# Patient Record
Sex: Female | Born: 1980
Health system: Southern US, Community
[De-identification: ages and names within clinical notes are randomized; demographics above are authoritative.]

## PROBLEM LIST (undated history)

## (undated) HISTORY — PX: BREAST EXCISIONAL BIOPSY: SUR124

---

## 2019-05-27 ENCOUNTER — Encounter (HOSPITAL_COMMUNITY): Payer: Self-pay | Admitting: Emergency Medicine

## 2019-05-27 ENCOUNTER — Other Ambulatory Visit: Payer: Self-pay

## 2019-05-27 ENCOUNTER — Emergency Department (HOSPITAL_COMMUNITY)
Admission: EM | Admit: 2019-05-27 | Discharge: 2019-05-28 | Disposition: A | Discharge status: Home / Self Care | Payer: 59 | Attending: Emergency Medicine | Admitting: Emergency Medicine

## 2019-05-27 DIAGNOSIS — Z5321 Procedure and treatment not carried out due to patient leaving prior to being seen by health care provider: Secondary | ICD-10-CM | POA: Insufficient documentation

## 2019-05-27 DIAGNOSIS — H5712 Ocular pain, left eye: Secondary | ICD-10-CM | POA: Insufficient documentation

## 2019-05-27 NOTE — ED Triage Notes (Signed)
Pt c/o pain on her left eye, states something got inside her eye this evening.

## 2019-12-16 ENCOUNTER — Other Ambulatory Visit: Payer: Self-pay | Admitting: Family Medicine

## 2019-12-16 DIAGNOSIS — H539 Unspecified visual disturbance: Secondary | ICD-10-CM

## 2020-01-04 ENCOUNTER — Ambulatory Visit
Admission: RE | Admit: 2020-01-04 | Discharge: 2020-01-04 | Disposition: A | Discharge status: Home / Self Care | Payer: 59 | Source: Ambulatory Visit | Attending: Family Medicine | Admitting: Family Medicine

## 2020-01-04 DIAGNOSIS — H539 Unspecified visual disturbance: Secondary | ICD-10-CM

## 2020-01-28 ENCOUNTER — Ambulatory Visit
Admission: RE | Admit: 2020-01-28 | Discharge: 2020-01-28 | Disposition: A | Discharge status: Home / Self Care | Payer: 59 | Source: Ambulatory Visit | Attending: Family Medicine | Admitting: Family Medicine

## 2020-01-28 ENCOUNTER — Other Ambulatory Visit: Payer: Self-pay

## 2020-01-28 MED ORDER — GADOBENATE DIMEGLUMINE 529 MG/ML IV SOLN
20.0000 mL | Freq: Once | INTRAVENOUS | Status: AC | PRN
  Administered 2020-01-28: 20 mL via INTRAVENOUS

## 2020-02-19 ENCOUNTER — Ambulatory Visit
Admission: RE | Admit: 2020-02-19 | Discharge: 2020-02-19 | Disposition: A | Discharge status: Home / Self Care | Payer: 59 | Source: Ambulatory Visit | Attending: Family Medicine | Admitting: Family Medicine

## 2020-02-19 ENCOUNTER — Ambulatory Visit: Payer: 59 | Admitting: Allergy

## 2020-02-19 ENCOUNTER — Other Ambulatory Visit: Payer: Self-pay | Admitting: Family Medicine

## 2020-02-19 ENCOUNTER — Other Ambulatory Visit: Payer: Self-pay

## 2020-02-19 DIAGNOSIS — R071 Chest pain on breathing: Secondary | ICD-10-CM

## 2020-02-19 MED ORDER — IOPAMIDOL (ISOVUE-370) INJECTION 76%
75.0000 mL | Freq: Once | INTRAVENOUS | Status: AC | PRN
  Administered 2020-02-19: 75 mL via INTRAVENOUS

## 2020-02-27 ENCOUNTER — Ambulatory Visit: Payer: 59 | Attending: Internal Medicine

## 2020-02-27 DIAGNOSIS — Z23 Encounter for immunization: Secondary | ICD-10-CM

## 2020-02-27 NOTE — Progress Notes (Signed)
   Covid-19 Vaccination Clinic  Name:  Kim Olson    MRN: [id number] DOB: 1980-05-14  02/27/2020  Kim Olson was observed post Covid-19 immunization for 15 minutes without incident. She was provided with Vaccine Information Sheet and instruction to access the V-Safe system.   Kim Olson was instructed to call 911 with any severe reactions post vaccine: Marland Kitchen Difficulty breathing  . Swelling of face and throat  . A fast heartbeat  . A bad rash all over body  . Dizziness and weakness   Immunizations Administered    Name Date Dose VIS Date Route   Pfizer COVID-19 Vaccine 02/27/2020  3:16 PM 0.3 mL 12/10/2019 Intramuscular   Manufacturer: [employer], [employer]   Lot: [id number]   NDC: [address]-8

## 2020-08-31 ENCOUNTER — Other Ambulatory Visit: Payer: Self-pay | Admitting: Family Medicine

## 2020-08-31 DIAGNOSIS — R1010 Upper abdominal pain, unspecified: Secondary | ICD-10-CM

## 2020-09-17 ENCOUNTER — Ambulatory Visit
Admission: RE | Admit: 2020-09-17 | Discharge: 2020-09-17 | Disposition: A | Discharge status: Home / Self Care | Payer: 59 | Source: Ambulatory Visit | Attending: Family Medicine | Admitting: Family Medicine

## 2020-09-17 ENCOUNTER — Other Ambulatory Visit: Payer: Self-pay

## 2020-09-17 DIAGNOSIS — R1010 Upper abdominal pain, unspecified: Secondary | ICD-10-CM

## 2021-06-29 ENCOUNTER — Other Ambulatory Visit: Payer: Self-pay | Admitting: Sports Medicine

## 2021-06-29 DIAGNOSIS — M25561 Pain in right knee: Secondary | ICD-10-CM

## 2021-07-04 ENCOUNTER — Other Ambulatory Visit: Payer: 59

## 2021-07-04 ENCOUNTER — Ambulatory Visit
Admission: RE | Admit: 2021-07-04 | Discharge: 2021-07-04 | Disposition: A | Discharge status: Home / Self Care | Payer: 59 | Source: Ambulatory Visit | Attending: Sports Medicine | Admitting: Sports Medicine

## 2021-07-04 DIAGNOSIS — M25561 Pain in right knee: Secondary | ICD-10-CM

## 2022-03-28 ENCOUNTER — Encounter: Payer: 59 | Admitting: Genetic Counselor

## 2022-03-28 ENCOUNTER — Other Ambulatory Visit: Payer: 59

## 2022-05-09 ENCOUNTER — Inpatient Hospital Stay: Payer: 59 | Attending: Genetic Counselor | Admitting: Genetic Counselor

## 2022-05-09 ENCOUNTER — Other Ambulatory Visit: Payer: Self-pay

## 2022-05-09 ENCOUNTER — Encounter: Payer: Self-pay | Admitting: Genetic Counselor

## 2022-05-09 ENCOUNTER — Inpatient Hospital Stay: Payer: 59

## 2022-05-09 DIAGNOSIS — Z8052 Family history of malignant neoplasm of bladder: Secondary | ICD-10-CM

## 2022-05-09 DIAGNOSIS — Z8041 Family history of malignant neoplasm of ovary: Secondary | ICD-10-CM | POA: Diagnosis not present

## 2022-05-09 DIAGNOSIS — Z801 Family history of malignant neoplasm of trachea, bronchus and lung: Secondary | ICD-10-CM | POA: Diagnosis not present

## 2022-05-09 DIAGNOSIS — Z803 Family history of malignant neoplasm of breast: Secondary | ICD-10-CM | POA: Diagnosis not present

## 2022-05-09 DIAGNOSIS — Z807 Family history of other malignant neoplasms of lymphoid, hematopoietic and related tissues: Secondary | ICD-10-CM

## 2022-05-09 LAB — MISCELLANEOUS TEST

## 2022-05-09 NOTE — Progress Notes (Unsigned)
REFERRING PROVIDER: [facility] @ [place],  [place] [address]  PRIMARY PROVIDER:  [place] [place] [facility] [place]  PRIMARY REASON FOR VISIT:  No diagnosis found.   HISTORY OF PRESENT ILLNESS:   Kim Olson, a 42 y.o. female, was seen for a [facility] cancer genetics consultation at the request of Dr. Sheron Nightingale due to a family history of cancer.  Kim Olson presents to clinic today to discuss the possibility of a hereditary predisposition to cancer, to discuss genetic testing, and to further clarify her future cancer risks, as well as potential cancer risks for family members.   Kim Olson is a 42 y.o. female with no personal history of cancer.    CANCER HISTORY:  Oncology History   No history exists.    RISK FACTORS:  Menarche was at age 54.  Nulliparous.  HRT use: 0 years. Mammogram within the last year: no. Number of breast biopsies: 1.  No past medical history on file.  No past surgical history on file.  Social History   Socioeconomic History   Marital status: Married    Spouse name: Not on file   Number of children: Not on file   Years of education: Not on file   Highest education level: Not on file  Occupational History   Not on file  Tobacco Use   Smoking status: Never   Smokeless tobacco: Never  Substance and Sexual Activity   Alcohol use: Never   Drug use: Never   Sexual activity: Not on file  Other Topics Concern   Not on file  Social History Narrative   Not on file   Social Determinants of Health   Financial Resource Strain: Not on file  Food Insecurity: Not on file  Transportation Needs: Not on file  Physical Activity: Not on file  Stress: Not on file  Social Connections: Not on file     FAMILY HISTORY:  We obtained a detailed, 4-generation family history.  Significant diagnoses are listed below: Family History  Problem Relation Age of Onset   Cancer - Other Mother 61       Ureter cancer    Bladder Cancer Mother    Cancer - Lung Mother    Cancer - Ovarian Maternal Aunt 46   Lymphoma Maternal Aunt    Bladder Cancer Maternal Uncle 11   Breast cancer Maternal Grandmother 63   Cancer - Lung Maternal Grandmother 3   Breast cancer Maternal Great-grandmother        Kim Olson mother was diagnosed with ureter, bladder, and lung cancer and passed away at 49. Her maternal aunt had ovarian cancer at 67. Another maternal aunt had lymphoma at 60, and her maternal uncle had bladder cancer at 69 and passed away within the year. Kim Olson also has a family history of breast cancer in her maternal grandmother and great-grandmother.  Kim Olson is unaware of previous family history of genetic testing for hereditary cancer risks. There is no reported Ashkenazi Jewish ancestry, however patient's maternal and paternal ancestors are of unknown descent. There is no known consanguinity.  GENETIC COUNSELING ASSESSMENT: Kim Olson is a 42 y.o. female with a family history of cancer which is somewhat suggestive of a hereditary predisposition to cancer. We, therefore, discussed and recommended the following at today's visit.   DISCUSSION: We discussed that 5 - 10% of cancer is hereditary, with most cases of hereditary ureter, bladder, and ovarian cancer associated with Lynch syndrome. We discussed  that testing is beneficial for several reasons, including knowing about other cancer risks, identifying potential screening and risk-reduction options that may be appropriate, and to understanding if other family members could be at risk for cancer and allowing them to undergo genetic testing.  We reviewed the characteristics, features and inheritance patterns of hereditary cancer syndromes. We also discussed genetic testing, including the appropriate family members to test, the process of testing, insurance coverage and turn-around-time for results. We discussed the implications of a negative, positive,  carrier and/or variant of uncertain significant result. We discussed that negative results would be uninformative given that Kim Olson does not have a personal history of cancer. We recommended Kim Olson pursue genetic testing for a panel that contains genes associated with Lynch syndrome as well as other common hereditary cancers such as breast cancer.  Kim Olson was offered a common hereditary cancer panel (47 genes) and an expanded pan-cancer panel (77 genes). Kim Olson was informed of the benefits and limitations of each panel, including that expanded pan-cancer panels contain several genes that do not have clear management guidelines at this point in time.  We also discussed that as the number of genes included on a panel increases, the chances of variants of uncertain significance increases.  After considering the benefits and limitations of each gene panel, Kim Olson elected to have the common hereditary cancer panel (47 genes).   Based on Kim Olson family history of cancer, she meets medical criteria for genetic testing. Despite that she meets criteria, she may still have an out of pocket cost. We discussed that if her out of pocket cost for testing is over $100, the laboratory will call and confirm whether she wants to proceed with testing.  If the out of pocket cost of testing is less than $100 she will be billed by the genetic testing laboratory.   We discussed that some people do not want to undergo genetic testing due to fear of genetic discrimination.  A federal law called the Genetic Information Non-Discrimination Act (GINA) of 2008 helps protect individuals against genetic discrimination based on their genetic test results.  It impacts both health insurance and employment.  With health insurance, it protects against increased premiums, being kicked off insurance or being forced to take a test in order to be insured.  For employment it protects against hiring, firing and promoting  decisions based on genetic test results.  GINA does not apply to those in the [employer], those who work for companies with less than 15 employees, and new life insurance or long-term disability insurance policies.  Health status due to a cancer diagnosis is not protected under GINA.  PLAN: After considering the risks, benefits, and limitations, Kim Olson provided informed consent to pursue genetic testing and the blood sample was [facility] to [facility] for analysis of the common hereditary cancer panel (47 genes). Results should be available within approximately 2-3 weeks' time, at which point they will be disclosed by telephone to Kim Olson, as will any additional recommendations warranted by these results. Kim Olson will receive a summary of her genetic counseling visit and a copy of her results once available. This information will also be available in Epic.   Lastly, we encouraged Kim Olson to remain in contact with cancer genetics annually so that we can continuously update the family history and inform her of any changes in cancer genetics and testing that may be of benefit for this family.   Kim Olson questions were answered  to her satisfaction today. Our contact information was provided should additional questions or concerns arise. Thank you for the referral and allowing [place] to share in the care of your patient.   Lucille Passy, MS, [facility] Genetic Counselor [email or web address] (P) [phone]  The patient was seen for a total of 45 minutes in face-to-face genetic counseling.  ***The patient brought ***.  ***The patient was seen alone.  Drs. Lindi Adie and/or Burr Medico were available to discuss this case as needed.  _______________________________________________________________________ For Office Staff:  Number of people involved in session: *** Was an Intern/ student involved with case: {YES/NO:63}

## 2022-05-10 ENCOUNTER — Other Ambulatory Visit (HOSPITAL_COMMUNITY): Payer: Self-pay | Admitting: Pain Medicine

## 2022-05-10 ENCOUNTER — Ambulatory Visit (HOSPITAL_COMMUNITY)
Admission: RE | Admit: 2022-05-10 | Discharge: 2022-05-10 | Disposition: A | Discharge status: Home / Self Care | Payer: 59 | Source: Ambulatory Visit | Attending: Pain Medicine | Admitting: Pain Medicine

## 2022-05-10 ENCOUNTER — Encounter: Payer: Self-pay | Admitting: Genetic Counselor

## 2022-05-10 DIAGNOSIS — R109 Unspecified abdominal pain: Secondary | ICD-10-CM | POA: Insufficient documentation

## 2022-05-10 DIAGNOSIS — Z8052 Family history of malignant neoplasm of bladder: Secondary | ICD-10-CM | POA: Insufficient documentation

## 2022-05-12 ENCOUNTER — Other Ambulatory Visit: Payer: Self-pay | Admitting: Nurse Practitioner

## 2022-05-12 DIAGNOSIS — Z1231 Encounter for screening mammogram for malignant neoplasm of breast: Secondary | ICD-10-CM

## 2022-05-17 ENCOUNTER — Other Ambulatory Visit: Payer: Self-pay | Admitting: Family Medicine

## 2022-05-17 DIAGNOSIS — N200 Calculus of kidney: Secondary | ICD-10-CM

## 2022-05-18 ENCOUNTER — Ambulatory Visit
Admission: RE | Admit: 2022-05-18 | Discharge: 2022-05-18 | Disposition: A | Discharge status: Home / Self Care | Payer: 59 | Source: Ambulatory Visit | Attending: Family Medicine | Admitting: Family Medicine

## 2022-05-18 DIAGNOSIS — N200 Calculus of kidney: Secondary | ICD-10-CM

## 2022-05-22 ENCOUNTER — Encounter: Payer: Self-pay | Admitting: Genetic Counselor

## 2022-05-22 ENCOUNTER — Telehealth: Payer: Self-pay | Admitting: Genetic Counselor

## 2022-05-22 DIAGNOSIS — Z1379 Encounter for other screening for genetic and chromosomal anomalies: Secondary | ICD-10-CM | POA: Insufficient documentation

## 2022-05-22 DIAGNOSIS — Z9189 Other specified personal risk factors, not elsewhere classified: Secondary | ICD-10-CM | POA: Insufficient documentation

## 2022-05-22 NOTE — Telephone Encounter (Signed)
I contacted [name] to discuss her genetic testing results. No pathogenic variants were identified in the 34 genes analyzed. Detailed clinic note to follow.  The test report has been scanned into EPIC and is located under the Molecular Pathology section of the Results Review tab.  A portion of the result report is included below for reference.   Kim Passy, MS, [facility] Genetic Counselor [email or web address] (P) [phone]

## 2022-06-02 ENCOUNTER — Ambulatory Visit: Payer: Self-pay | Admitting: Genetic Counselor

## 2022-06-02 DIAGNOSIS — Z1379 Encounter for other screening for genetic and chromosomal anomalies: Secondary | ICD-10-CM

## 2022-06-02 NOTE — Progress Notes (Signed)
HPI:   Ms. Kim Olson was previously seen in the [facility] due to a family history of cancer and concerns regarding a hereditary predisposition to cancer. Please refer to our prior cancer genetics clinic note for more information regarding our discussion, assessment and recommendations, at the time. Kim Olson recent genetic test results were disclosed to her, as were recommendations warranted by these results. These results and recommendations are discussed in more detail below.  FAMILY HISTORY:  We obtained a detailed, 4-generation family history.  Significant diagnoses are listed below:      Family History  Problem Relation Age of Onset   Cancer - Other Mother 88        Ureter cancer   Bladder Cancer Mother     Cancer - Lung Mother     Cancer - Ovarian Maternal Aunt 34   Lymphoma Maternal Aunt     Bladder Cancer Maternal Uncle 33   Breast cancer Maternal Grandmother 20   Cancer - Lung Maternal Grandmother 5   Breast cancer Maternal Great-grandmother             Ms. Kim Olson mother was diagnosed with ureter, bladder, and lung cancer at age 52 and passed away at 60. Her maternal aunt had ovarian cancer at 63. Another maternal aunt had lymphoma at 32. Her maternal uncle was diagnosed with bladder cancer at 29 and passed away within the year. Kim Olson also has a family history of breast cancer in her maternal grandmother and maternal great-grandmother (grandfather's mother), they are both deceased. Of note, she does not have any information about her paternal family medical history.    Ms. Kim Olson is unaware of previous family history of genetic testing for hereditary cancer risks. There is no reported Ashkenazi Jewish ancestry, however patient's maternal and paternal ancestors are of unknown descent. There is no known consanguinity.  GENETIC TEST RESULTS:  The [employer] CancerNext Panel found no pathogenic mutations.   The CancerNext gene panel offered by [employer] includes sequencing, rearrangement analysis, and RNA analysis for the following 34 genes:  APC, ATM, AXIN2, BARD1, BMPR1A, BRCA1, BRCA2, BRIP1, CDH1, CDK4, CDKN2A, CHEK2, DICER1, HOXB13, EPCAM, GREM1, MLH1, MSH2, MSH3, MSH6, MUTYH, NF1, NTHL1, PALB2, PMS2, POLD1, POLE, PTEN, RAD51C, RAD51D, SMAD4, SMARCA4, STK11, and TP53.   The test report has been scanned into EPIC and is located under the Molecular Pathology section of the Results Review tab.  A portion of the result report is included below for reference. Genetic testing reported out on 05/16/2022.       Even though a pathogenic variant was not identified, possible explanations for the cancer in the family may include: There may be no hereditary risk for cancer in the family. The cancers in her family may be due to other genetic or environmental factors. There may be a gene mutation in one of these genes that current testing methods cannot detect, but that chance is small. There could be another gene that has not yet been discovered, or that we have not yet tested, that is responsible for the cancer diagnoses in the family.  It is also possible there is a hereditary cause for the cancer in the family that Kim Olson did not inherit.  Therefore, it is important to remain in touch with cancer genetics in the future so that we can continue to offer Kim Olson the most up to date genetic testing.    ADDITIONAL GENETIC TESTING:  We discussed with Kim Olson that her  genetic testing was fairly extensive.  If there are genes identified to increase cancer risk that can be analyzed in the future, we would be happy to discuss and coordinate this testing at that time.    CANCER SCREENING RECOMMENDATIONS:  Ms. Kim Olson test result is considered negative (normal). This means that we have not identified a hereditary cause for her family history of cancer at this time. An individual's cancer risk and medical management are not determined by genetic  test results alone. Overall cancer risk assessment incorporates additional factors, including personal medical history, family history, and any available genetic information that may result in a personalized plan for cancer prevention and surveillance. Therefore, it is recommended she continue to follow the cancer management and screening guidelines provided by her primary healthcare provider.  Based on the reported personal and family history, specific cancer screenings for Kim Olson and her family include:  Breast Cancer Screening:  The Tyrer-Cuzick model is one of multiple prediction models developed to estimate an individual's lifetime risk of developing breast cancer. The Tyrer-Cuzick model is endorsed by the [employer] (NCCN). This model includes many risk factors such as family history, endogenous estrogen exposure, and benign breast disease. The calculation is highly-dependent on the accuracy of clinical data provided by the patient and can change over time. The Tyrer-Cuzick model may be repeated to reflect new information in her personal or family history in the future.   Kim Olson Tyrer-Cuzick risk score is 22.1%.  For women with a greater than 20% lifetime risk of breast cancer, the NCCN recommends the following:    1.   Clinical encounter every 6-12 months to begin when identified as being at increased risk, but not before age 63    2.   Annual mammograms, tomosynthesis is recommended starting 10 years earlier than the youngest breast cancer diagnosis in the family or at age 67 (whichever comes first), but not before age 21     80.   Annual breast MRI starting 10 years earlier than the youngest breast cancer diagnosis in the family or at age 6 (whichever comes first), but not before age 81  Therefore, we have placed a referral to [facility].  RECOMMENDATIONS FOR FAMILY MEMBERS:   Other members of the family may still carry a  pathogenic variant in one of these genes that Kim Olson did not inherit. Based on the family history, we recommend her maternal aunt, who was diagnosed with ovarian cancer have genetic counseling and testing.   FOLLOW-UP:  Cancer genetics is a rapidly advancing field and it is possible that new genetic tests will be appropriate for her and/or her family members in the future. We encouraged her to remain in contact with cancer genetics on an annual basis so we can update her personal and family histories and let her know of advances in cancer genetics that may benefit this family.   Our contact number was provided. Ms. Graciano questions were answered to her satisfaction, and she knows she is welcome to call us at anytime with additional questions or concerns.   Lalla Brothers, MS, [facility] Genetic Counselor [email or web address] (P) [phone]

## 2022-06-08 ENCOUNTER — Other Ambulatory Visit: Payer: 59

## 2022-06-08 ENCOUNTER — Inpatient Hospital Stay: Payer: 59 | Admitting: Hematology and Oncology

## 2022-06-08 NOTE — Assessment & Plan Note (Deleted)
Genetic testing: Negative 1.  Risk assessment: [name] model: 5-year risk B.  Tyrer-Cuzick model: 10-year risk C.  Tyrer-Cuzick model: Lifetime risk  2. Risk reduction: A.  Pharmacological risk reduction: Tamoxifen versus raloxifene B.  Nonpharmacological risk reduction: Stress importance of eating healthy, diet, exercise, supplements like vitamin D and turmeric, avoiding alcohol  3.  Breast cancer surveillance: A.  Mammograms annually B.  Breast MRIs annually versus every 2 to 3 years  Return to clinic in 1 year for follow-up

## 2022-06-27 ENCOUNTER — Ambulatory Visit: Payer: 59

## 2022-06-28 ENCOUNTER — Inpatient Hospital Stay: Payer: 59 | Attending: Genetic Counselor | Admitting: Hematology and Oncology

## 2022-06-28 ENCOUNTER — Inpatient Hospital Stay: Payer: 59

## 2022-06-28 ENCOUNTER — Other Ambulatory Visit: Payer: Self-pay

## 2022-06-28 VITALS — BP 133/77 | HR 86 | Temp 98.2°F | Resp 18 | Ht 66.0 in | Wt 232.8 lb

## 2022-06-28 DIAGNOSIS — Z807 Family history of other malignant neoplasms of lymphoid, hematopoietic and related tissues: Secondary | ICD-10-CM | POA: Diagnosis not present

## 2022-06-28 DIAGNOSIS — Z801 Family history of malignant neoplasm of trachea, bronchus and lung: Secondary | ICD-10-CM | POA: Insufficient documentation

## 2022-06-28 DIAGNOSIS — Z8052 Family history of malignant neoplasm of bladder: Secondary | ICD-10-CM

## 2022-06-28 DIAGNOSIS — Z803 Family history of malignant neoplasm of breast: Secondary | ICD-10-CM | POA: Insufficient documentation

## 2022-06-28 DIAGNOSIS — Z86018 Personal history of other benign neoplasm: Secondary | ICD-10-CM | POA: Diagnosis present

## 2022-06-28 DIAGNOSIS — Z8049 Family history of malignant neoplasm of other genital organs: Secondary | ICD-10-CM

## 2022-06-28 NOTE — Progress Notes (Signed)
[facility] CONSULT NOTE  Patient Care Team: [facility], [facility] [place] as PCP - General (Family Medicine)  CHIEF COMPLAINTS/PURPOSE OF CONSULTATION:  At high risk for breast cancer  ASSESSMENT & PLAN:  No problem-specific Assessment & Plan notes found for this encounter.  Orders Placed This Encounter  Procedures   MM 3D SCREENING MAMMOGRAM BILATERAL BREAST    Standing Status:   Future    Standing Expiration Date:   06/28/2023    Order Specific Question:   Reason for Exam (SYMPTOM  OR DIAGNOSIS REQUIRED)    Answer:   High risk for breast cancer    Order Specific Question:   Preferred imaging location?    Answer:   [facility]    Order Specific Question:   Is the patient pregnant?    Answer:   No   We discussed her life time risk of breast cancer.  TC model to be about 22%.  2. We discussed different risk reducing strategies for future breast cancer risk.  We discussed role of ongoing surveillance with mammograms versus MRIs.  We discussed about unknown long-term risk of gadolinium deposition with MRIs and increased sensitivity of MRIs which may lead to additional biopsies.  3. Lifestyle modification: We discussed different interventions exercise at least 5 days a week including both aerobic as well as weight-bearing exercises and strength training. He discussed dietary modification including increasing number of servings of fruit and vegetables as well as decreased in number of servings of meat. We discussed the importance of maintaining a good BMI and limiting alcohol intake.  4. Surveillance: Patient certainly would be a good candidate for ongoing mammograms on a yearly basis alternating with MRIs.  She was not entirely sure of moving forward with MRIs.  She wondered if we can call her to follow-up on this.  5. Chemoprevention: She does not qualify for chemoprevention, her 5-year risk of breast cancer was around 1.1% based on Gail's model  6.  Genetics: Genetic testing negative  Return to clinic in 1 year.  She will alternate between [place] and her gynecologist for breast exam.  She will also continue to do self breast exam and report any changes to hospital  HISTORY OF PRESENTING ILLNESS:  Kim Olson 42 y.o. female is here because of high risk for breast cancer.  This is a very pleasant 42 year old female patient referred to breast clinic for family history of breast cancer as well as high lifetime risk of breast cancer.  She was seen by our genetics team for family history of breast cancer, had genetic testing which showed no evidence of pathogenic mutations.  She was however found to have a lifetime risk of breast cancer greater than 20% and hence referred to breast clinic.  She arrived to the appointment today by herself.  She tells me that many years ago she had a fibroadenoma and this was removed in 2009.  Other than this no breast biopsies or no other atypical hyperplasia or malignancy is noted.  She is in good health, she is nulliparous.  Age at menarche was approximately 42 years old.  No use of hormone replacement therapy.  Family history notable for breast cancer in maternal grandmother, maternal great grandmother, mother had ureter cancer and bladder cancer. She denies any major complaints today.  Pertinent review of systems reviewed and negative  MEDICAL HISTORY:  No past medical history on file.  SURGICAL HISTORY: No past surgical history on file.  SOCIAL HISTORY: Social History  Socioeconomic History   Marital status: Married    Spouse name: Not on file   Number of children: Not on file   Years of education: Not on file   Highest education level: Not on file  Occupational History   Not on file  Tobacco Use   Smoking status: Never   Smokeless tobacco: Never  Substance and Sexual Activity   Alcohol use: Never   Drug use: Never   Sexual activity: Not on file  Other Topics Concern   Not on file  Social  History Narrative   Not on file   Social Determinants of Health   Financial Resource Strain: Not on file  Food Insecurity: Not on file  Transportation Needs: Not on file  Physical Activity: Not on file  Stress: Not on file  Social Connections: Not on file  Intimate Partner Violence: Not on file    FAMILY HISTORY: Family History  Problem Relation Age of Onset   Cancer - Other Mother 31       Ureter cancer   Bladder Cancer Mother    Lung cancer Mother    Ovarian cancer Maternal Aunt 24   Lymphoma Maternal Aunt    Bladder Cancer Maternal Uncle 66   Breast cancer Maternal Grandmother 24   Lung cancer Maternal Grandmother 84   Breast cancer Maternal Great-grandmother        maternal grandfather's mother    ALLERGIES:  is allergic to shellfish allergy.  MEDICATIONS:  Current Outpatient Medications  Medication Sig Dispense Refill   levonorgestrel-ethinyl estradiol (ALESSE) 0.1-20 MG-MCG tablet Take 1 tablet by mouth daily.     No current facility-administered medications for this visit.     PHYSICAL EXAMINATION: ECOG PERFORMANCE STATUS: 0 - Asymptomatic  Vitals:   06/28/22 1415  BP: 133/77  Pulse: 86  Resp: 18  Temp: 98.2 F (36.8 C)  SpO2: 100%   Filed Weights   06/28/22 1415  Weight: 232 lb 12.8 oz (105.6 kg)    GENERAL:alert, no distress and comfortable Breast exam: Bilateral breasts inspected and palpated.  No palpable masses or regional adenopathy.  LABORATORY DATA:  I have reviewed the data as listed No results found for: "WBC", "HGB", "HCT", "MCV", "PLT"   Chemistry   No results found for: "NA", "K", "CL", "CO2", "BUN", "CREATININE", "GLU" No results found for: "CALCIUM", "ALKPHOS", "AST", "ALT", "BILITOT"     RADIOGRAPHIC STUDIES: I have personally reviewed the radiological images as listed and agreed with the findings in the report. No results found.  All questions were answered. The patient knows to call the clinic with any problems,  questions or concerns. I spent 45 minutes in the care of this patient including H and P, review of records, counseling and coordination of care.     Rachel Moulds, MD 06/29/2022 2:00 PM

## 2022-07-26 ENCOUNTER — Inpatient Hospital Stay (HOSPITAL_BASED_OUTPATIENT_CLINIC_OR_DEPARTMENT_OTHER): Payer: 59 | Admitting: Hematology and Oncology

## 2022-07-26 DIAGNOSIS — Z9189 Other specified personal risk factors, not elsewhere classified: Secondary | ICD-10-CM

## 2022-07-26 NOTE — Progress Notes (Signed)
MRI ordered Pt given phone number for mammogram since she didn't hear from the breast cancer There is an active order for both the mammogram and MRI  Rachel Moulds MD

## 2022-08-01 ENCOUNTER — Ambulatory Visit
Admission: RE | Admit: 2022-08-01 | Discharge: 2022-08-01 | Disposition: A | Discharge status: Home / Self Care | Payer: 59 | Source: Ambulatory Visit | Attending: Hematology and Oncology | Admitting: Hematology and Oncology

## 2022-08-01 DIAGNOSIS — Z803 Family history of malignant neoplasm of breast: Secondary | ICD-10-CM

## 2022-08-03 ENCOUNTER — Other Ambulatory Visit: Payer: Self-pay | Admitting: Hematology and Oncology

## 2022-08-03 DIAGNOSIS — R928 Other abnormal and inconclusive findings on diagnostic imaging of breast: Secondary | ICD-10-CM

## 2022-08-04 ENCOUNTER — Ambulatory Visit
Admission: RE | Admit: 2022-08-04 | Discharge: 2022-08-04 | Disposition: A | Discharge status: Home / Self Care | Payer: 59 | Source: Ambulatory Visit | Attending: Hematology and Oncology | Admitting: Hematology and Oncology

## 2022-08-04 DIAGNOSIS — R928 Other abnormal and inconclusive findings on diagnostic imaging of breast: Secondary | ICD-10-CM

## 2022-08-07 ENCOUNTER — Other Ambulatory Visit: Payer: Self-pay | Admitting: Hematology and Oncology

## 2022-08-07 DIAGNOSIS — R921 Mammographic calcification found on diagnostic imaging of breast: Secondary | ICD-10-CM

## 2022-08-18 ENCOUNTER — Ambulatory Visit
Admission: RE | Admit: 2022-08-18 | Discharge: 2022-08-18 | Disposition: A | Discharge status: Home / Self Care | Payer: 59 | Source: Ambulatory Visit | Attending: Hematology and Oncology | Admitting: Hematology and Oncology

## 2022-08-18 DIAGNOSIS — R921 Mammographic calcification found on diagnostic imaging of breast: Secondary | ICD-10-CM

## 2022-08-18 HISTORY — PX: BREAST BIOPSY: SHX20

## 2022-09-07 ENCOUNTER — Other Ambulatory Visit: Payer: Self-pay | Admitting: Hematology and Oncology

## 2022-09-07 ENCOUNTER — Ambulatory Visit
Admission: RE | Admit: 2022-09-07 | Discharge: 2022-09-07 | Disposition: A | Discharge status: Home / Self Care | Payer: 59 | Source: Ambulatory Visit | Attending: Hematology and Oncology | Admitting: Hematology and Oncology

## 2022-09-07 DIAGNOSIS — N6489 Other specified disorders of breast: Secondary | ICD-10-CM

## 2023-02-08 ENCOUNTER — Encounter: Payer: Self-pay | Admitting: Hematology and Oncology

## 2023-05-14 ENCOUNTER — Other Ambulatory Visit: Payer: Self-pay | Admitting: Hematology and Oncology

## 2023-05-14 DIAGNOSIS — Z1231 Encounter for screening mammogram for malignant neoplasm of breast: Secondary | ICD-10-CM

## 2023-06-17 ENCOUNTER — Other Ambulatory Visit

## 2023-08-02 ENCOUNTER — Ambulatory Visit
Admission: RE | Admit: 2023-08-02 | Discharge: 2023-08-02 | Disposition: A | Discharge status: Home / Self Care | Source: Ambulatory Visit | Attending: Hematology and Oncology | Admitting: Hematology and Oncology

## 2023-08-02 DIAGNOSIS — Z1231 Encounter for screening mammogram for malignant neoplasm of breast: Secondary | ICD-10-CM

## 2024-02-05 ENCOUNTER — Other Ambulatory Visit: Payer: Self-pay | Admitting: *Deleted

## 2024-02-05 DIAGNOSIS — Z9189 Other specified personal risk factors, not elsewhere classified: Secondary | ICD-10-CM

## 2024-02-29 ENCOUNTER — Ambulatory Visit: Payer: Self-pay | Admitting: Family Medicine

## 2024-03-11 ENCOUNTER — Ambulatory Visit
Admission: RE | Admit: 2024-03-11 | Discharge: 2024-03-11 | Disposition: A | Source: Ambulatory Visit | Attending: Hematology and Oncology

## 2024-03-11 DIAGNOSIS — Z9189 Other specified personal risk factors, not elsewhere classified: Secondary | ICD-10-CM

## 2024-03-11 MED ORDER — GADOPICLENOL 0.5 MMOL/ML IV SOLN
10.0000 mL | Freq: Once | INTRAVENOUS | Status: AC | PRN
  Administered 2024-03-11: 10 mL via INTRAVENOUS

## 2024-03-12 ENCOUNTER — Ambulatory Visit: Payer: Self-pay | Admitting: Hematology and Oncology

## 2024-05-03 IMAGING — MR MR KNEE*R* W/O CM
4 of 7 series · 19 of 40 positions shown · non-contrast
Comparison: Radiograph 06/24/2021

CLINICAL DATA: Right knee pain

EXAM:
MRI OF THE RIGHT KNEE WITHOUT CONTRAST
TECHNIQUE: Multiplanar, multisequence MR imaging of the knee was performed. No
intravenous contrast was administered.

[Series 3: T2 fat-sat · axial · 4.0mm · 0.29mm/px · z∈[-101,+4]mm · 3 of 25 slices shown]
[im 1/25]
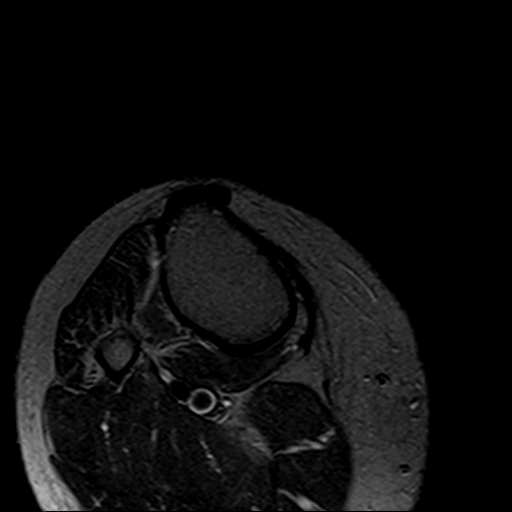
[im 13/25]
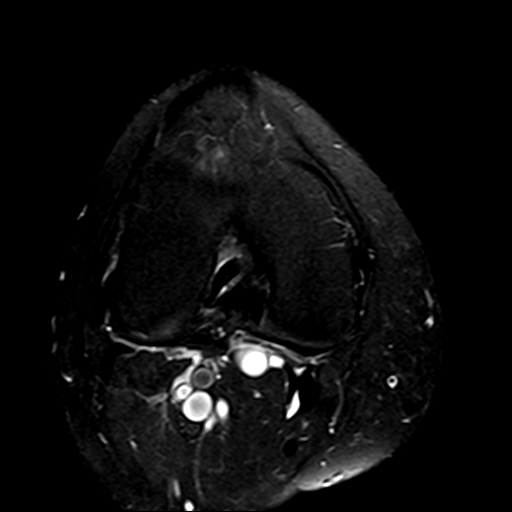
[im 25/25]
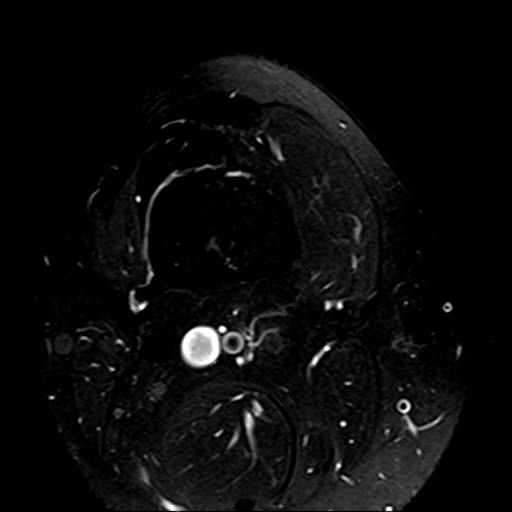

[Series 6: PD fat-sat · coronal · 3.0mm · 0.29mm/px · 7 of 28 slices shown (1 of 3)]
[im 1/28]
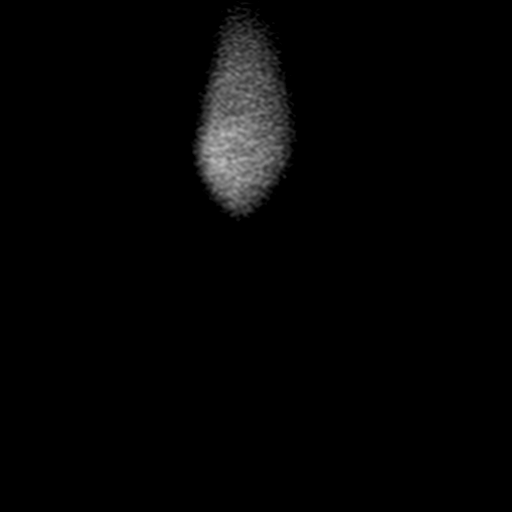
[im 5/28]
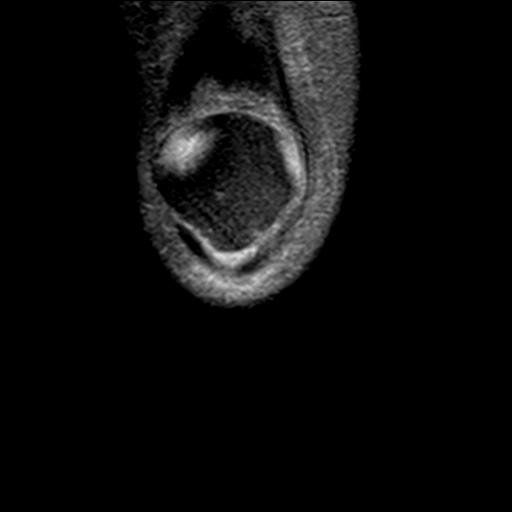
[im 10/28]
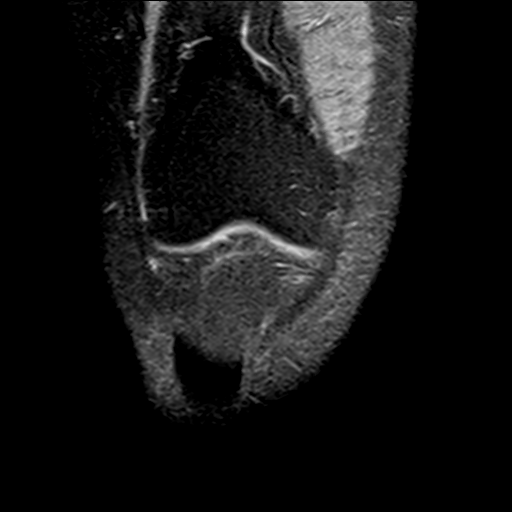
[im 14/28]
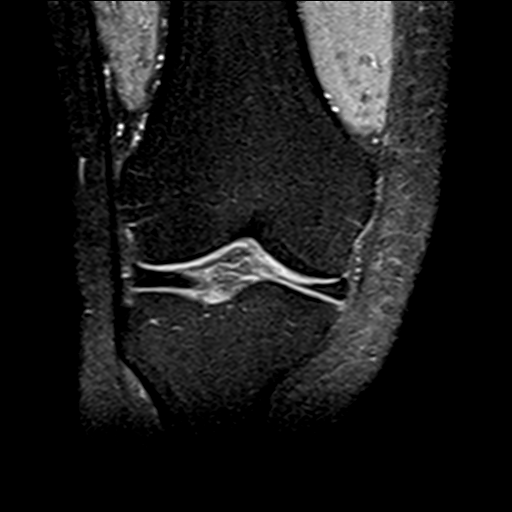
[im 19/28]
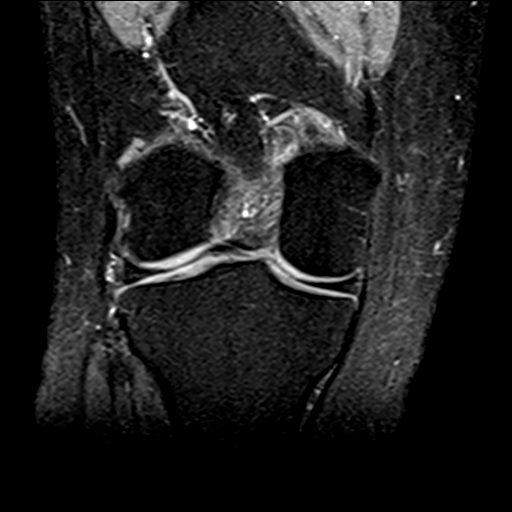
[im 23/28]
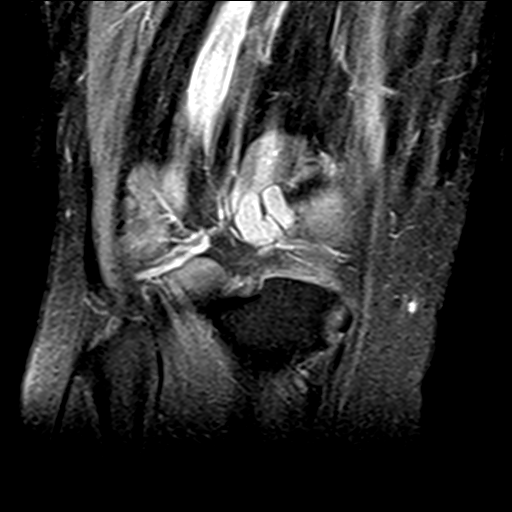
[im 28/28]
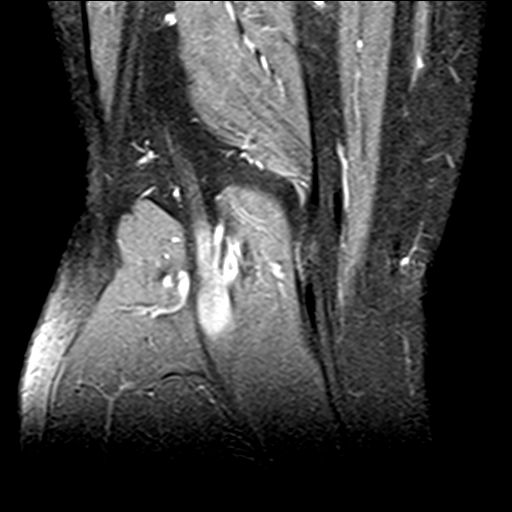

[Series 7: PD fat-sat · sagittal · 3.0mm · 0.29mm/px · 7 of 28 slices shown (2 of 3)]
[im 1/28]
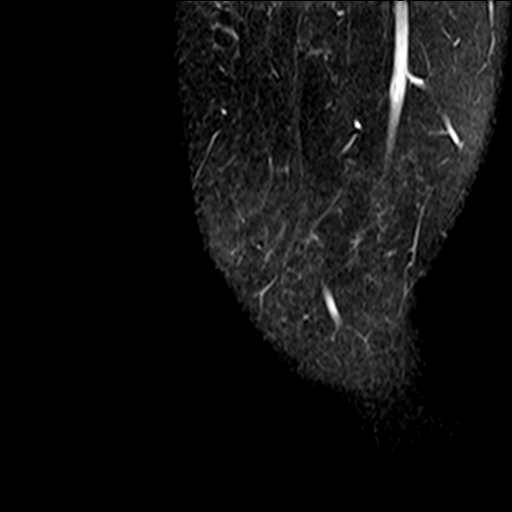
[im 5/28]
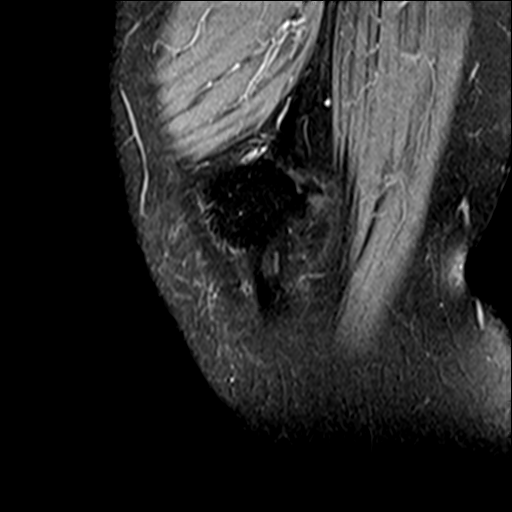
[im 10/28]
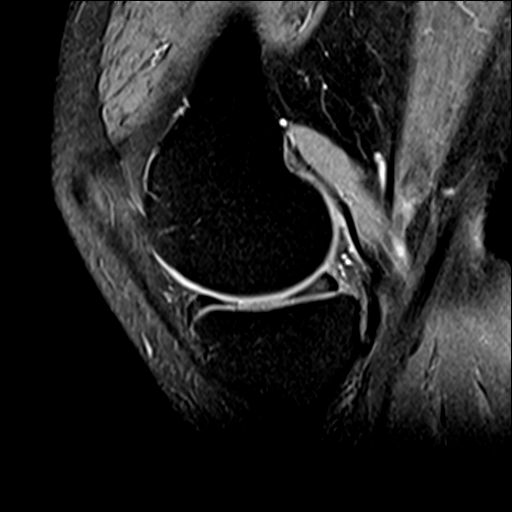
[im 14/28]
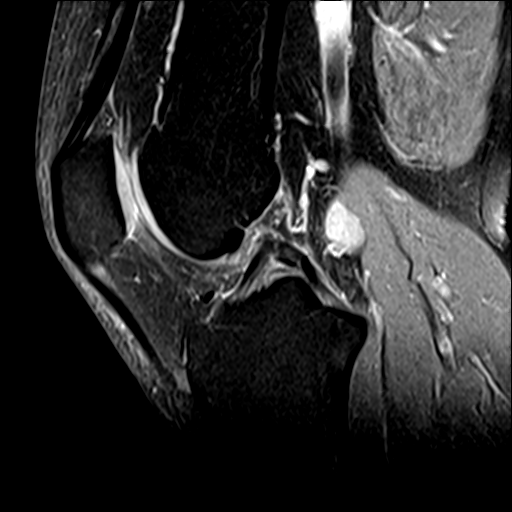
[im 19/28]
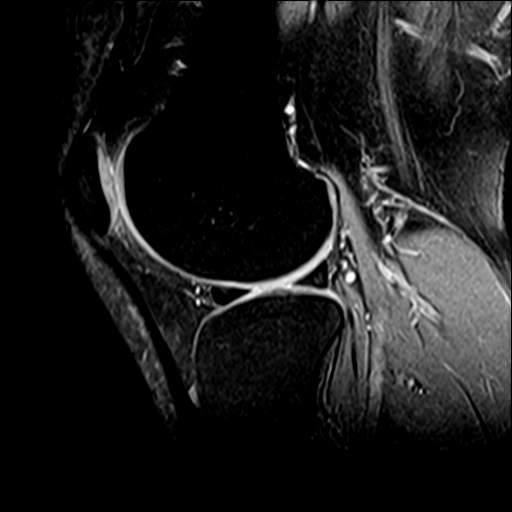
[im 23/28]
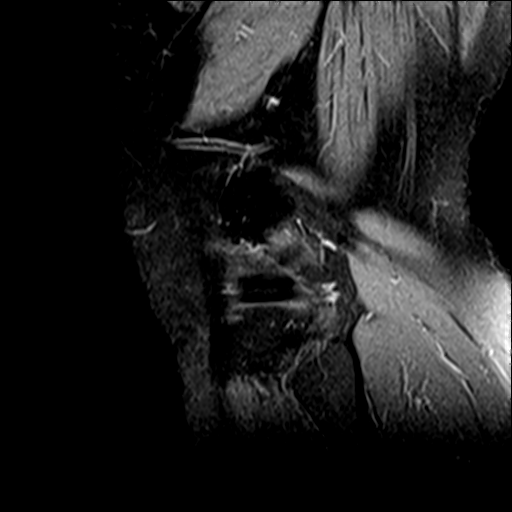
[im 28/28]
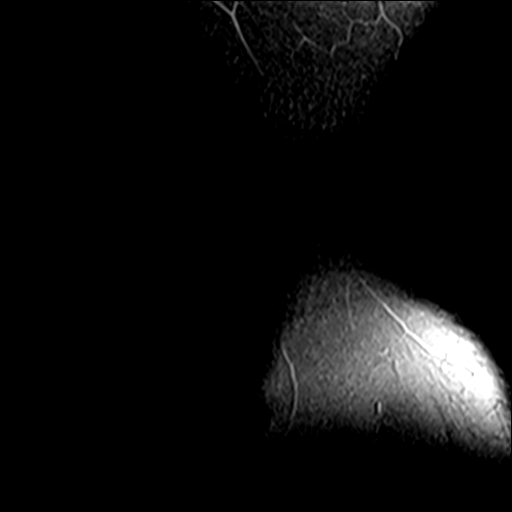

[Series 9: PD fat-sat · oblique · 2.0mm · 0.29mm/px · 2 of 10 slices shown (3 of 3)]
[im 1/10]
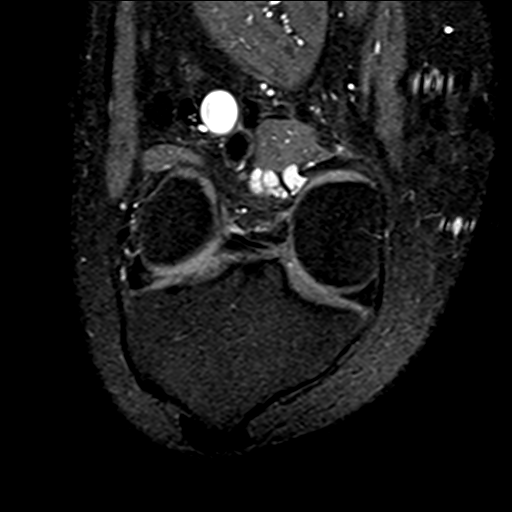
[im 10/10]
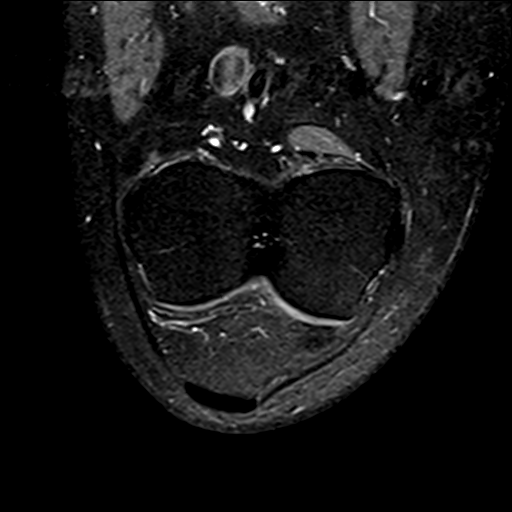

[19 of 40 positions shown; findings below may reference images not displayed]

FINDINGS: MENISCI

Medial: Intact

Lateral: Intact

LIGAMENTS

Cruciates: ACL and PCL are intact.

Collaterals: Medial collateral ligament is intact. Lateral
collateral ligament complex is intact.

CARTILAGE

Patellofemoral:  Mild chondrosis.  No focal defect.

Medial:  Mild chondrosis.  No focal defect

Lateral:  No chondral defect.

JOINT: No significant joint effusion.

POPLITEAL FOSSA: There is a small Baker's cyst. Either separate
ganglion cyst or Baker's cyst extension towards the central aspect
of the joint along the posterior joint capsule, measuring 1.7 x
x 2.3 cm (series 3, image 12, sagittal T2 image 16).

EXTENSOR MECHANISM: Intact quadriceps tendon. Intact patellar tendon
with mild tendinosis at the patellar attachment.

BONES: No aggressive osseous lesion. No fracture or dislocation.

Other: No fluid collection or hematoma. Muscles are normal.
IMPRESSION: No evidence of meniscus tear or ligamentous injury. Mild medial and
patellofemoral chondrosis.

Small Baker's cyst with either cyst extension or separate ganglion
cyst along central posterior joint capsule measuring 1.7 x 1.1 x
cm.
